# Patient Record
Sex: Female | Born: 1960 | Race: White | Hispanic: No | Marital: Single | Smoking: Never smoker
Health system: Southern US, Community
[De-identification: ages and names within clinical notes are randomized; demographics above are authoritative.]

---

## 2005-11-11 ENCOUNTER — Ambulatory Visit: Payer: Self-pay | Admitting: Cardiology

## 2011-03-17 ENCOUNTER — Ambulatory Visit (HOSPITAL_COMMUNITY): Payer: Self-pay | Admitting: Psychiatry

## 2019-05-10 ENCOUNTER — Other Ambulatory Visit: Payer: Self-pay

## 2019-05-10 ENCOUNTER — Emergency Department (HOSPITAL_COMMUNITY): Payer: Self-pay

## 2019-05-10 ENCOUNTER — Encounter (HOSPITAL_COMMUNITY): Payer: Self-pay | Admitting: Emergency Medicine

## 2019-05-10 ENCOUNTER — Emergency Department (HOSPITAL_COMMUNITY)
Admission: EM | Admit: 2019-05-10 | Discharge: 2019-05-10 | Disposition: A | Discharge status: Home / Self Care | Payer: Self-pay | Attending: Emergency Medicine | Admitting: Emergency Medicine

## 2019-05-10 ENCOUNTER — Ambulatory Visit (HOSPITAL_COMMUNITY)
Admission: EM | Admit: 2019-05-10 | Discharge: 2019-05-10 | Disposition: A | Discharge status: Home / Self Care | Payer: Self-pay

## 2019-05-10 DIAGNOSIS — R109 Unspecified abdominal pain: Secondary | ICD-10-CM | POA: Insufficient documentation

## 2019-05-10 DIAGNOSIS — F419 Anxiety disorder, unspecified: Secondary | ICD-10-CM | POA: Insufficient documentation

## 2019-05-10 DIAGNOSIS — R5383 Other fatigue: Secondary | ICD-10-CM | POA: Insufficient documentation

## 2019-05-10 DIAGNOSIS — R0789 Other chest pain: Secondary | ICD-10-CM | POA: Insufficient documentation

## 2019-05-10 LAB — URINALYSIS, ROUTINE W REFLEX MICROSCOPIC
Bacteria, UA: NONE SEEN
Bilirubin Urine: NEGATIVE
Glucose, UA: NEGATIVE mg/dL
Ketones, ur: 5 mg/dL — AB
Nitrite: NEGATIVE
Protein, ur: NEGATIVE mg/dL
Specific Gravity, Urine: 1.019 (ref 1.005–1.030)
pH: 5 (ref 5.0–8.0)

## 2019-05-10 LAB — TROPONIN I (HIGH SENSITIVITY): Troponin I (High Sensitivity): 2 ng/L (ref <18)

## 2019-05-10 LAB — CBC
HCT: 42.8 % (ref 36.0–46.0)
Hemoglobin: 13.9 g/dL (ref 12.0–15.0)
MCH: 29.3 pg (ref 26.0–34.0)
MCHC: 32.5 g/dL (ref 30.0–36.0)
MCV: 90.1 fL (ref 80.0–100.0)
Platelets: 178 10*3/uL (ref 150–400)
RBC: 4.75 MIL/uL (ref 3.87–5.11)
RDW: 12.6 % (ref 11.5–15.5)
WBC: 4.2 10*3/uL (ref 4.0–10.5)
nRBC: 0 % (ref 0.0–0.2)

## 2019-05-10 LAB — BASIC METABOLIC PANEL
Anion gap: 10 (ref 5–15)
BUN: 19 mg/dL (ref 6–20)
CO2: 27 mmol/L (ref 22–32)
Calcium: 10 mg/dL (ref 8.9–10.3)
Chloride: 105 mmol/L (ref 98–111)
Creatinine, Ser: 1 mg/dL (ref 0.44–1.00)
GFR calc Af Amer: >60 mL/min (ref >60)
GFR calc non Af Amer: >60 mL/min (ref >60)
Glucose, Bld: 89 mg/dL (ref 70–99)
Potassium: 4.1 mmol/L (ref 3.5–5.1)
Sodium: 142 mmol/L (ref 135–145)

## 2019-05-10 LAB — I-STAT BETA HCG BLOOD, ED (MC, WL, AP ONLY): I-stat hCG, quantitative: <5 m[IU]/mL (ref <5)

## 2019-05-10 LAB — HEPATIC FUNCTION PANEL
ALT: 21 U/L (ref 0–44)
AST: 27 U/L (ref 15–41)
Albumin: 4.3 g/dL (ref 3.5–5.0)
Alkaline Phosphatase: 69 U/L (ref 38–126)
Bilirubin, Direct: 0.2 mg/dL (ref 0.0–0.2)
Indirect Bilirubin: 0.6 mg/dL (ref 0.3–0.9)
Total Bilirubin: 0.8 mg/dL (ref 0.3–1.2)
Total Protein: 7 g/dL (ref 6.5–8.1)

## 2019-05-10 LAB — LIPASE, BLOOD: Lipase: 39 U/L (ref 11–51)

## 2019-05-10 MED ORDER — SODIUM CHLORIDE 0.9% FLUSH: 3.0000 mL | Freq: Once | INTRAVENOUS | Status: DC

## 2019-05-10 MED ORDER — SODIUM CHLORIDE 0.9 % IV BOLUS
1000.0000 mL | Freq: Once | INTRAVENOUS | Status: AC
  Administered 2019-05-10: 1000 mL via INTRAVENOUS

## 2019-05-10 NOTE — ED Provider Notes (Signed)
[facility]   CSN: [id number] Arrival date & time: 05/10/19  1005    History   Chief Complaint Chief Complaint  Patient presents with   Fatigue    HPI Emily Byrd is a 58 y.o. female.     Patient with no medical history who presents here for generalized fatigue over the last several days.  Has had some intermittent chest discomfort, abdominal discomfort, sweats, dehydration type symptoms.  She works as a Teacher, adult educationmassage therapist.  Does not know of anybody with coronavirus but has high risk exposures as she does work as a Teacher, adult educationmassage therapist.  Has been eating and drinking okay.  Currently does not have any chest pain or discomfort.  Mostly she just complains of some anxiety, generalized fatigue.  She took some Xanax this morning to see if that would help with her anxiety.  It has.  Patient denies any fever, chills, cough.  The history is provided by the patient.  Illness Location:  General Severity:  Mild Onset quality:  Gradual Timing:  Intermittent Progression:  Waxing and waning Chronicity:  Recurrent Relieved by:  Nothing Worsened by:  Nothing Associated symptoms: abdominal pain, chest pain and myalgias   Associated symptoms: no cough, no ear pain, no fever, no rash, no shortness of breath, no sore throat and no vomiting     History reviewed. No pertinent past medical history.  There are no active problems to display for this patient.   History reviewed. No pertinent surgical history.   OB History   No obstetric history on file.      Home Medications    Prior to Admission medications   Not on File    Family History No family history on file.  Social History Social History   Tobacco Use   Smoking status: Not on file  Substance Use Topics   Alcohol use: Not on file   Drug use: Not on file     Allergies   Patient has no known allergies.   Review of Systems Review of Systems  Constitutional:  Negative for chills and fever.  HENT: Negative for ear pain and sore throat.   Eyes: Negative for pain and visual disturbance.  Respiratory: Negative for cough and shortness of breath.   Cardiovascular: Positive for chest pain. Negative for palpitations.  Gastrointestinal: Positive for abdominal pain. Negative for vomiting.  Genitourinary: Negative for dysuria and hematuria.  Musculoskeletal: Positive for myalgias. Negative for arthralgias and back pain.  Skin: Negative for color change and rash.  Neurological: Negative for seizures and syncope.  Psychiatric/Behavioral: The patient is nervous/anxious.   All other systems reviewed and are negative.    Physical Exam Updated Vital Signs  ED Triage Vitals [05/10/19 1019]  Enc Vitals Group     BP (!) 164/90     Pulse Rate 70     Resp 16     Temp 98.2 F (36.8 C)     Temp Source Oral     SpO2 99 %     Weight      Height      Head Circumference      Peak Flow      Pain Score 2     Pain Loc      Pain Edu?      Excl. in GC?     Physical Exam Vitals signs and nursing note reviewed.  Constitutional:      General: She is not in acute distress.  Appearance: She is well-developed. She is not ill-appearing.  HENT:     Head: Normocephalic and atraumatic.     Nose: Nose normal.     Mouth/Throat:     Mouth: Mucous membranes are moist.  Eyes:     Extraocular Movements: Extraocular movements intact.     Conjunctiva/sclera: Conjunctivae normal.     Pupils: Pupils are equal, round, and reactive to light.  Neck:     Musculoskeletal: Normal range of motion and neck supple.  Cardiovascular:     Rate and Rhythm: Normal rate and regular rhythm.     Pulses: Normal pulses.     Heart sounds: Normal heart sounds. No murmur.  Pulmonary:     Effort: Pulmonary effort is normal. No respiratory distress.     Breath sounds: Normal breath sounds.  Abdominal:     Palpations: Abdomen is soft.     Tenderness: There is no abdominal tenderness.    Skin:    General: Skin is warm and dry.     Capillary Refill: Capillary refill takes less than 2 seconds.  Neurological:     General: No focal deficit present.     Mental Status: She is alert and oriented to person, place, and time.     Cranial Nerves: No cranial nerve deficit.     Sensory: No sensory deficit.     Motor: No weakness.     Coordination: Coordination normal.     Comments: Normal speech, 5+ out of 5 strength, normal sensation  Psychiatric:        Mood and Affect: Mood normal.      ED Treatments / Results  Labs (all labs ordered are listed, but only abnormal results are displayed) Labs Reviewed  URINALYSIS, ROUTINE W REFLEX MICROSCOPIC - Abnormal; Notable for the following components:      Result Value   Hgb urine dipstick SMALL (*)    Ketones, ur 5 (*)    Leukocytes,Ua TRACE (*)    All other components within normal limits  NOVEL CORONAVIRUS, NAA (HOSPITAL ORDER, SEND-OUT TO REF LAB)  BASIC METABOLIC PANEL  CBC  HEPATIC FUNCTION PANEL  LIPASE, BLOOD  I-STAT BETA HCG BLOOD, ED ([facility], [facility], [facility] ONLY)  TROPONIN I (HIGH SENSITIVITY)    EKG EKG Interpretation  Date/Time:  Friday May 10 2019 10:12:07 EDT Ventricular Rate:  74 PR Interval:  150 QRS Duration: 74 QT Interval:  376 QTC Calculation: 417 R Axis:   64 Text Interpretation:  Normal sinus rhythm Confirmed by Virgina NorfolkAdam, Wendel Homeyer [phone]([id number]) on 05/10/2019 11:05:07 AM   Radiology Dg Chest 2 View  Result Date: 05/10/2019 CLINICAL DATA:  Stabbing chest pain 3 nights ago with diaphoresis, fatigue and dizziness today. Elevated blood pressure. EXAM: CHEST - 2 VIEW COMPARISON:  Radiographs 01/24/2015. FINDINGS: The heart size and mediastinal contours are normal. The lungs are clear. There is no pleural effusion or pneumothorax. No acute osseous findings are identified. IMPRESSION: Stable chest.  No active cardiopulmonary process. Electronically Signed   By: Carey BullocksWilliam  Veazey M.D.   On: 05/10/2019 10:39     Procedures Procedures (including critical care time)  Medications Ordered in ED Medications  sodium chloride flush (NS) 0.9 % injection 3 mL (3 mLs Intravenous Not Given 05/10/19 1145)  sodium chloride 0.9 % bolus 1,000 mL (0 mLs Intravenous Stopped 05/10/19 1427)     Initial Impression / Assessment and Plan / ED Course  I have reviewed the triage vital signs and the nursing notes.  Pertinent labs & imaging results  that were available during my care of the patient were reviewed by me and considered in my medical decision making (see chart for details).     Nicollette Wilhelmi is a 58 year old female with no significant medical history presents the ED with generalized weakness.  Patient with normal vitals.  No fever.  Patient over the last several days with intermittent chest discomfort, abdominal discomfort, fatigue, chills, body aches.  Works as a Geophysicist/field seismologist.  Does not know of anybody with coronavirus.  EKG shows sinus rhythm.  No ischemic changes.  Chest x-ray shows no signs of pneumonia, no pneumothorax, no pleural effusion.  Troponin normal.  No significant leukocytosis, anemia, electrolyte abnormality.  Kidney function normal.  Lipase, gallbladder enzymes and liver enzymes all normal.  Patient felt better after IV fluids.  Troponin normal.  Overall do not believe there is any acute cardiac process or pulmonary process.  Doubt PE.  Overall seems possibly viral in nature versus generalized fatigue.  She does not have any cardiac risk factors.  Does not have any chest pain now.  Patient would like to get outpatient coronavirus test as she states that she get it faster than the test that we have here.  She was educated about self isolation.  This chart was dictated using voice recognition software.  Despite best efforts to proofread,  errors can occur which can change the documentation meaning.  Kambre Messner was evaluated in Emergency Department on 05/10/2019 for the symptoms described in  the history of present illness. She was evaluated in the context of the global COVID-19 pandemic, which necessitated consideration that the patient might be at risk for infection with the SARS-CoV-2 virus that causes COVID-19. Institutional protocols and algorithms that pertain to the evaluation of patients at risk for COVID-19 are in a state of rapid change based on information released by regulatory bodies including the CDC and federal and state organizations. These policies and algorithms were followed during the patient's care in the ED.   Final Clinical Impressions(s) / ED Diagnoses   Final diagnoses:  Other fatigue    ED Discharge Orders    None       Lennice Sites, DO 05/10/19 1453

## 2019-05-10 NOTE — ED Triage Notes (Signed)
Pt reports episodes of chest pain with sweating and abd pain over the last 2-3 days that seem to come and go. 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

## 2019-05-10 NOTE — Discharge Instructions (Addendum)
Follow-up with [facility] center.  No signs of urinary tract infection.

## 2019-05-10 NOTE — ED Notes (Signed)
On an attempt to swab pt for COVID, pt stated she would rather be tested at a no-cost facility, pt states she could make an appointment with [facility] for tomorrow. Test clicked off in error.

## 2019-07-31 ENCOUNTER — Other Ambulatory Visit: Payer: Self-pay | Admitting: *Deleted

## 2019-07-31 DIAGNOSIS — Z20822 Contact with and (suspected) exposure to covid-19: Secondary | ICD-10-CM

## 2019-08-01 LAB — NOVEL CORONAVIRUS, NAA: SARS-CoV-2, NAA: NOT DETECTED

## 2019-09-18 ENCOUNTER — Other Ambulatory Visit: Payer: Self-pay

## 2019-09-18 DIAGNOSIS — Z20822 Contact with and (suspected) exposure to covid-19: Secondary | ICD-10-CM

## 2019-09-19 LAB — NOVEL CORONAVIRUS, NAA: SARS-CoV-2, NAA: NOT DETECTED

## 2019-11-07 ENCOUNTER — Ambulatory Visit: Payer: Self-pay | Attending: Internal Medicine

## 2019-11-07 DIAGNOSIS — Z20822 Contact with and (suspected) exposure to covid-19: Secondary | ICD-10-CM | POA: Insufficient documentation

## 2019-11-08 LAB — NOVEL CORONAVIRUS, NAA: SARS-CoV-2, NAA: NOT DETECTED

## 2020-09-02 IMAGING — DX CHEST - 2 VIEW
2 series · 2 of 2 positions shown · non-contrast
Comparison: Radiographs 01/24/2015.

CLINICAL DATA: Stabbing chest pain 3 nights ago with diaphoresis,
fatigue and dizziness today. Elevated blood pressure.

EXAM:
CHEST - 2 VIEW

[chest pa]
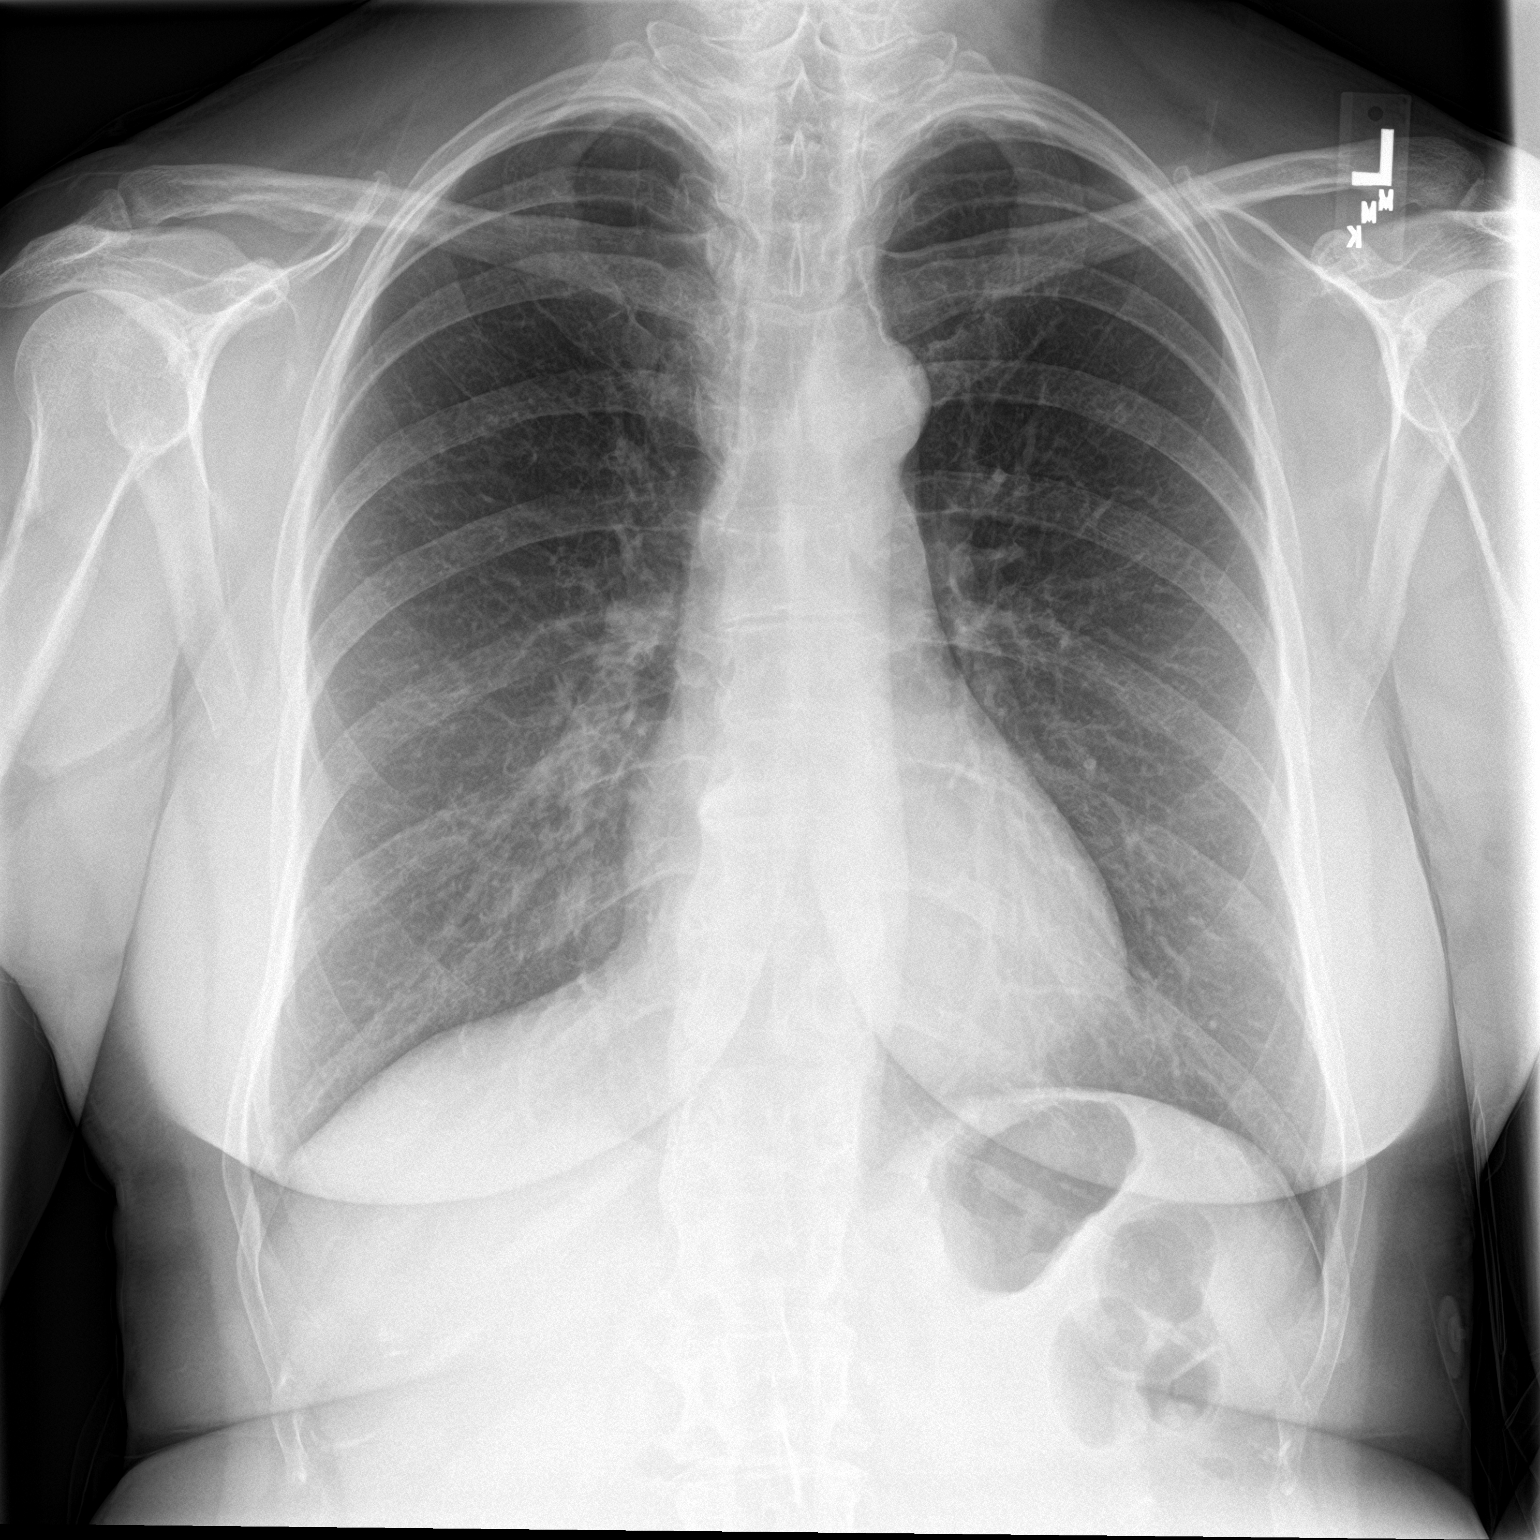

[chest lat]
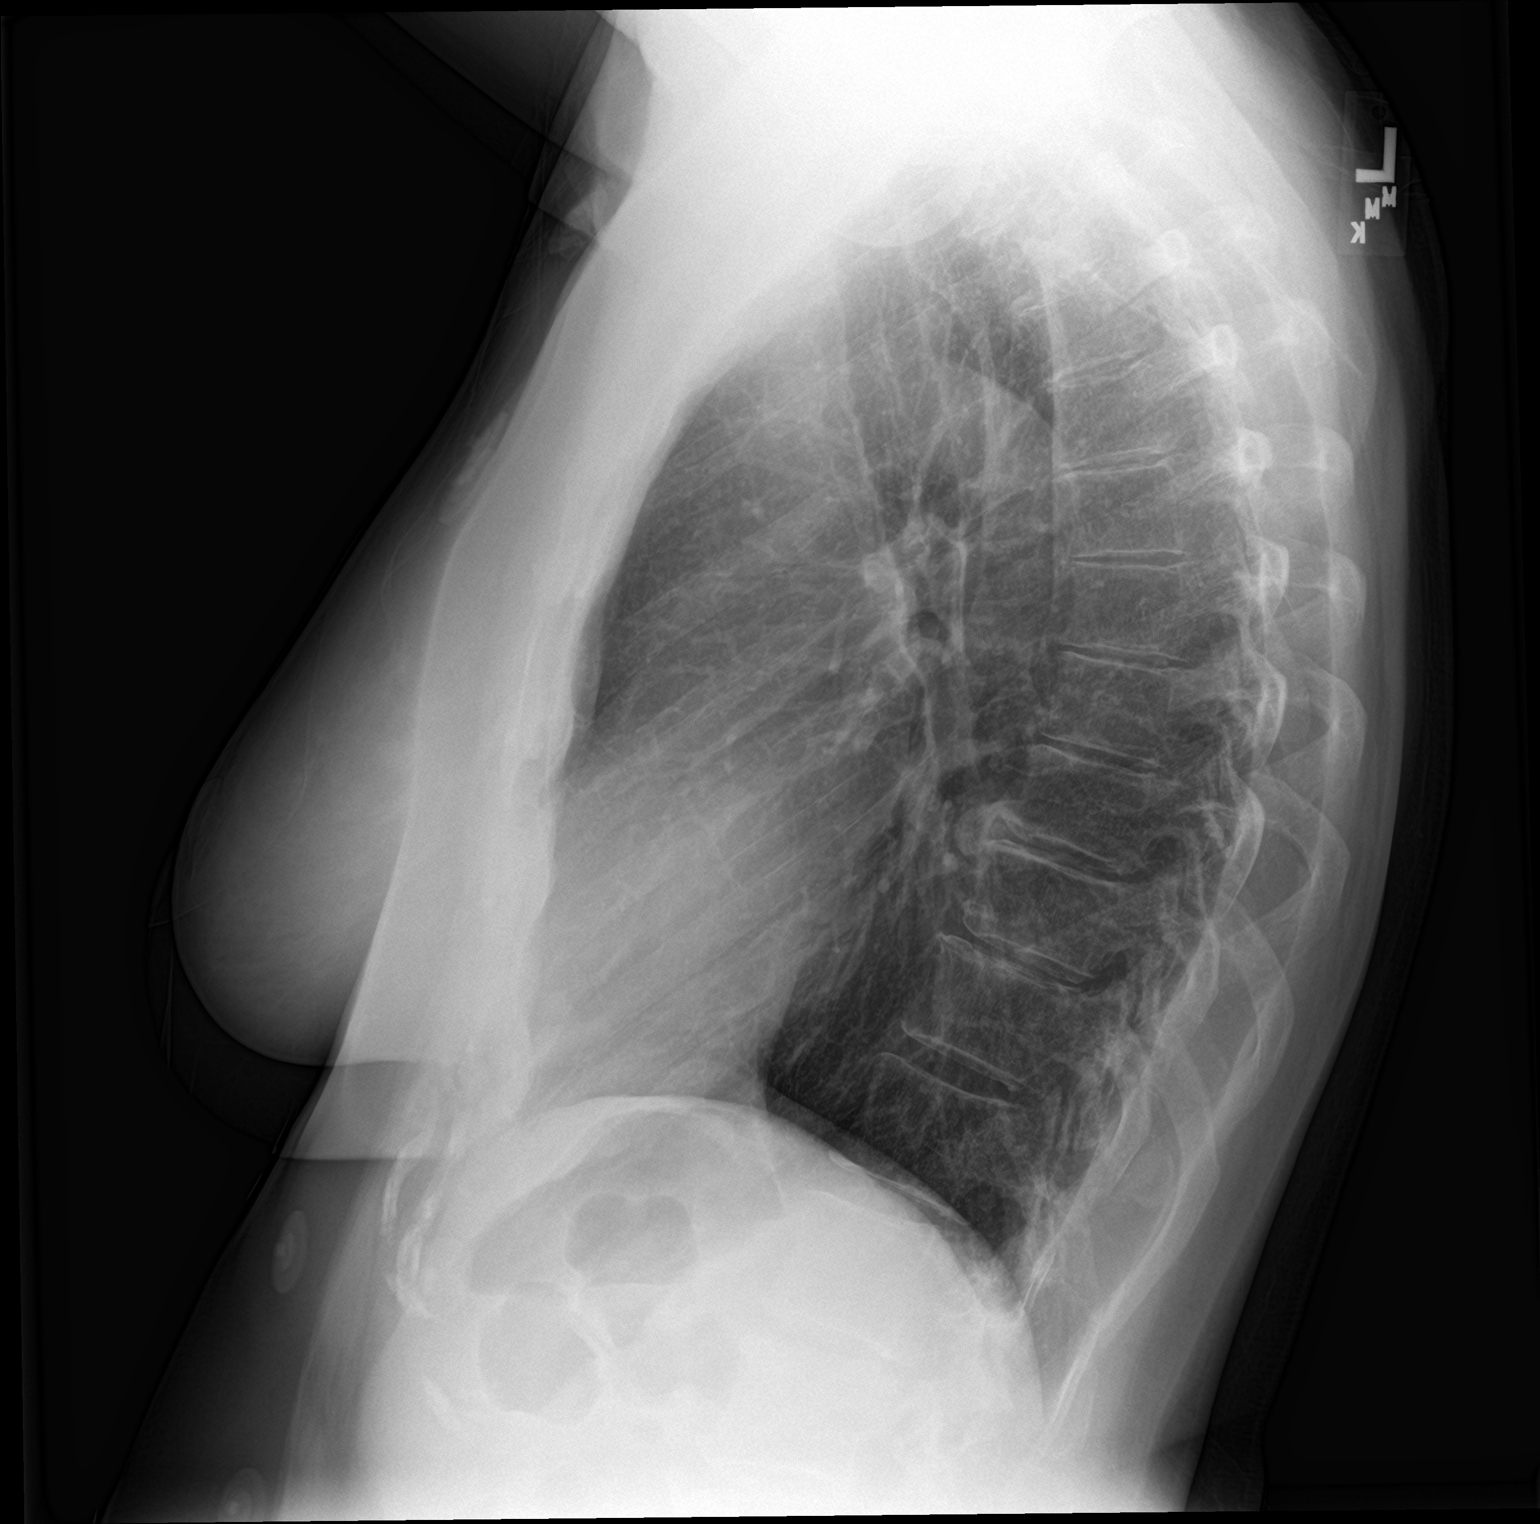

[2 of 2 positions shown; findings below may reference images not displayed]

FINDINGS: The heart size and mediastinal contours are normal. The lungs are
clear. There is no pleural effusion or pneumothorax. No acute
osseous findings are identified.
IMPRESSION: Stable chest.  No active cardiopulmonary process.

## 2021-08-13 ENCOUNTER — Encounter: Payer: Self-pay | Admitting: Emergency Medicine

## 2021-08-13 ENCOUNTER — Ambulatory Visit
Admission: EM | Admit: 2021-08-13 | Discharge: 2021-08-13 | Disposition: A | Discharge status: Home / Self Care | Payer: 59 | Attending: Family Medicine | Admitting: Family Medicine

## 2021-08-13 ENCOUNTER — Telehealth: Payer: Self-pay

## 2021-08-13 ENCOUNTER — Other Ambulatory Visit: Payer: Self-pay

## 2021-08-13 DIAGNOSIS — H60392 Other infective otitis externa, left ear: Secondary | ICD-10-CM

## 2021-08-13 MED ORDER — CEPHALEXIN 500 MG PO CAPS: 500.0000 mg | ORAL_CAPSULE | Freq: Two times a day (BID) | ORAL | 0 refills | Status: DC

## 2021-08-13 MED ORDER — CEPHALEXIN 500 MG PO CAPS: 500.0000 mg | ORAL_CAPSULE | Freq: Two times a day (BID) | ORAL | 0 refills | Status: AC

## 2021-08-13 NOTE — Discharge Instructions (Signed)
Apply Neosporin to the inside of the nose and the ear canal twice daily until symptoms resolve.  Keep the areas clean and dry.

## 2021-08-13 NOTE — ED Triage Notes (Signed)
Patient c/o LFT sided facial swelling x 3 days.   Patient states " it started as a bump on the left side of my face and then it felt like my ear was getting affected".   Patient endorses worsening pain over the past few days.   Patient denies any worsening of pain when chewing   Patient has history of clenching teeth.

## 2021-08-13 NOTE — ED Provider Notes (Signed)
[facility]    CSN: [id number] Arrival date & time: 08/13/21  5638      History   Chief Complaint Chief Complaint  Patient presents with   Facial Swelling    HPI Emily Byrd is a 60 y.o. female.   Patient presenting today with left-sided mild facial swelling, tenderness after scratching a bump on her cheek.  She states she also had a bump pop up in her left ear which she scratched off and has noticed subsequent tenderness, swelling directly in the area of the ear canal.  The pain is now radiating toward her jaw though no known dental issues.  She is also had a couple of sores pop up in the inside of her nose.  She denies fever, chills, drainage, headaches, difficulty breathing or swallowing.  So far trying lavender and tea tree oils to the areas with no relief.   History reviewed. No pertinent past medical history.  There are no problems to display for this patient.   History reviewed. No pertinent surgical history.  OB History   No obstetric history on file.      Home Medications    Prior to Admission medications   Medication Sig Start Date End Date Taking? Authorizing Provider  cephALEXin (KEFLEX) 500 MG capsule Take 1 capsule (500 mg total) by mouth 2 (two) times daily. 08/13/21   Particia Nearing, PA-C    Family History No family history on file.  Social History Social History   Tobacco Use   Smoking status: Never   Smokeless tobacco: Never  Substance Use Topics   Alcohol use: Not Currently   Drug use: Never     Allergies   Morphine and Propofol   Review of Systems Review of Systems Per HPI  Physical Exam Triage Vital Signs ED Triage Vitals  Enc Vitals Group     BP 08/13/21 1023 131/79     Pulse Rate 08/13/21 1023 73     Resp 08/13/21 1023 14     Temp 08/13/21 1023 97.9 F (36.6 C)     Temp Source 08/13/21 1023 Oral     SpO2 08/13/21 1023 98 %     Weight --      Height --      Head Circumference --      Peak Flow --       Pain Score 08/13/21 1025 4     Pain Loc --      Pain Edu? --      Excl. in GC? --    No data found.  Updated Vital Signs BP 131/79 (BP Location: Right Arm)   Pulse 73   Temp 97.9 F (36.6 C) (Oral)   Resp 14   SpO2 98%   Visual Acuity Right Eye Distance:   Left Eye Distance:   Bilateral Distance:    Right Eye Near:   Left Eye Near:    Bilateral Near:     Physical Exam Vitals and nursing note reviewed.  Constitutional:      Appearance: Normal appearance. She is not ill-appearing.  HENT:     Head: Atraumatic.     Right Ear: Tympanic membrane normal.     Left Ear: Tympanic membrane normal.     Ears:     Comments: Left EAC with erythema, edema at the site of a small ulceration that she states she scratched papule off of several days ago.  Tender to palpation.  No drainage or bleeding in the site.  Nose:     Comments: Erythema, minimally noticeable ulcerations to nasal septum without drainage    Mouth/Throat:     Mouth: Mucous membranes are moist.     Pharynx: No posterior oropharyngeal erythema.     Comments: No obvious dental infection Eyes:     Extraocular Movements: Extraocular movements intact.     Conjunctiva/sclera: Conjunctivae normal.  Cardiovascular:     Rate and Rhythm: Normal rate and regular rhythm.     Heart sounds: Normal heart sounds.  Pulmonary:     Effort: Pulmonary effort is normal.     Breath sounds: Normal breath sounds. No wheezing or rales.  Musculoskeletal:        General: Normal range of motion.     Cervical back: Normal range of motion and neck supple.  Skin:    General: Skin is warm and dry.  Neurological:     Mental Status: She is alert and oriented to person, place, and time.     Motor: No weakness.     Gait: Gait normal.  Psychiatric:        Mood and Affect: Mood normal.        Thought Content: Thought content normal.        Judgment: Judgment normal.    UC Treatments / Results  Labs (all labs ordered are listed, but  only abnormal results are displayed) Labs Reviewed - No data to display  EKG   Radiology No results found.  Procedures Procedures (including critical care time)  Medications Ordered in UC Medications - No data to display  Initial Impression / Assessment and Plan / UC Course  I have reviewed the triage vital signs and the nursing notes.  Pertinent labs & imaging results that were available during my care of the patient were reviewed by me and considered in my medical decision making (see chart for details).     Vital signs reassuring.  Will cover for bacterial infection of multiple sites to the facial area with Keflex, Neosporin, Hibiclens.  Discussed strict return precautions for worsening symptoms.  Final Clinical Impressions(s) / UC Diagnoses   Final diagnoses:  Infective otitis externa of left ear     Discharge Instructions      Apply Neosporin to the inside of the nose and the ear canal twice daily until symptoms resolve.  Keep the areas clean and dry.     ED Prescriptions     Medication Sig Dispense Auth. Provider   cephALEXin (KEFLEX) 500 MG capsule Take 1 capsule (500 mg total) by mouth 2 (two) times daily. 14 capsule Particia Nearing, [place]      PDMP not reviewed this encounter.   Particia Nearing, [place] 08/13/21 1121

## 2024-04-26 ENCOUNTER — Encounter: Payer: Self-pay | Admitting: Advanced Practice Midwife

## 2024-10-16 ENCOUNTER — Telehealth: Payer: Self-pay

## 2024-10-16 NOTE — Telephone Encounter (Signed)
 ___ Rosann forms received via Mychart/nurse line printed off by RN ___ Nurse portion completed ___ Forms/notes placed in Providersfolder for review and signature. ___ Forms completed by Provider and placed in completed Provider folder for office leadership pick up ___Forms completed by Provider and faxed to designated location, encounter closed
